# Patient Record
Sex: Female | Born: 1960 | Race: White | Hispanic: No | Marital: Married | Smoking: Never smoker
Health system: Southern US, Community
[De-identification: ages and names within clinical notes are randomized; demographics above are authoritative.]

## PROBLEM LIST (undated history)

## (undated) ENCOUNTER — Emergency Department (HOSPITAL_COMMUNITY): Admission: EM | Disposition: A | Discharge status: Home / Self Care | Payer: Self-pay

---

## 1997-12-29 ENCOUNTER — Encounter: Admission: RE | Admit: 1997-12-29 | Discharge: 1997-12-29 | Payer: Self-pay | Admitting: *Deleted

## 1998-03-23 ENCOUNTER — Ambulatory Visit (HOSPITAL_COMMUNITY): Admission: RE | Admit: 1998-03-23 | Discharge: 1998-03-23 | Payer: Self-pay | Admitting: Obstetrics & Gynecology

## 1998-04-18 ENCOUNTER — Encounter: Payer: Self-pay | Admitting: Obstetrics and Gynecology

## 1998-04-18 ENCOUNTER — Ambulatory Visit (HOSPITAL_COMMUNITY): Admission: RE | Admit: 1998-04-18 | Discharge: 1998-04-18 | Payer: Self-pay | Admitting: Obstetrics and Gynecology

## 1998-09-26 ENCOUNTER — Inpatient Hospital Stay (HOSPITAL_COMMUNITY): Admission: AD | Admit: 1998-09-26 | Discharge: 1998-09-28 | Payer: Self-pay | Admitting: Obstetrics and Gynecology

## 2010-03-01 ENCOUNTER — Encounter
Admission: RE | Admit: 2010-03-01 | Discharge: 2010-03-01 | Payer: Self-pay | Source: Home / Self Care | Attending: Orthopedic Surgery | Admitting: Orthopedic Surgery

## 2010-04-14 ENCOUNTER — Encounter: Payer: Self-pay | Admitting: Orthopedic Surgery

## 2012-05-19 ENCOUNTER — Ambulatory Visit: Payer: PRIVATE HEALTH INSURANCE | Admitting: Family Medicine

## 2012-05-19 VITALS — BP 106/68 | HR 71 | Temp 98.0°F | Resp 16 | Ht 65.5 in | Wt 124.0 lb

## 2012-05-19 DIAGNOSIS — Z5321 Procedure and treatment not carried out due to patient leaving prior to being seen by health care provider: Secondary | ICD-10-CM

## 2012-05-19 NOTE — Progress Notes (Signed)
Went to see pt- she was not in room and not in restrooms, must have left prior to being seen

## 2012-05-25 ENCOUNTER — Encounter: Payer: Self-pay | Admitting: Internal Medicine

## 2012-05-25 ENCOUNTER — Ambulatory Visit (INDEPENDENT_AMBULATORY_CARE_PROVIDER_SITE_OTHER): Payer: PRIVATE HEALTH INSURANCE | Admitting: Internal Medicine

## 2012-05-25 VITALS — BP 108/66 | HR 61 | Temp 98.3°F | Resp 18 | Ht 65.0 in | Wt 124.0 lb

## 2012-05-25 DIAGNOSIS — Z13220 Encounter for screening for lipoid disorders: Secondary | ICD-10-CM

## 2012-05-25 LAB — LIPID PANEL
Cholesterol: 212 mg/dL — ABNORMAL HIGH (ref 0–200)
HDL: 72 mg/dL (ref >39)
LDL Cholesterol: 125 mg/dL — ABNORMAL HIGH (ref 0–99)
Total CHOL/HDL Ratio: 2.9 Ratio
Triglycerides: 74 mg/dL (ref <150)
VLDL: 15 mg/dL (ref 0–40)

## 2012-05-25 NOTE — Progress Notes (Signed)
  Subjective:    Patient ID: Tiffany Santos, female    DOB: 03/06/1961, 52 y.o.   MRN: [id number]  HPI very healthy 52 year old no illnesses no medications in fine physical condition with no extra weight he needs a cholesterol screening to meet the requirements of her husband's insurance policy    Review of Systems 13 point review of systems negative    Objective:   Physical Exam Vital signs within normal limits       Assessment & Plan:  Problem #1 screening for hyperlipidemia

## 2013-09-28 ENCOUNTER — Ambulatory Visit: Payer: Managed Care, Other (non HMO) | Admitting: Rehabilitative and Restorative Service Providers"

## 2014-04-28 ENCOUNTER — Emergency Department (HOSPITAL_COMMUNITY)
Admission: EM | Admit: 2014-04-28 | Discharge: 2014-04-28 | Disposition: A | Discharge status: Home / Self Care | Payer: PRIVATE HEALTH INSURANCE | Source: Home / Self Care | Attending: Emergency Medicine | Admitting: Emergency Medicine

## 2014-04-28 ENCOUNTER — Encounter (HOSPITAL_COMMUNITY): Payer: Self-pay

## 2014-04-28 DIAGNOSIS — M25511 Pain in right shoulder: Secondary | ICD-10-CM

## 2014-04-28 MED ORDER — IBUPROFEN 800 MG PO TABS: 800.0000 mg | ORAL_TABLET | Freq: Three times a day (TID) | ORAL | Status: AC | PRN

## 2014-04-28 MED ORDER — HYDROCODONE-ACETAMINOPHEN 5-325 MG PO TABS: 1.0000 | ORAL_TABLET | Freq: Every evening | ORAL | Status: AC | PRN

## 2014-04-28 NOTE — ED Provider Notes (Signed)
CSN: [id number]     Arrival date & time 04/28/14  0851 History   First MD Initiated Contact with Patient 04/28/14 [id number]     Chief Complaint  Patient presents with  . Shoulder Pain   (Consider location/radiation/quality/duration/timing/severity/associated sxs/prior Treatment) HPI  She is a 54 year old woman here for evaluation of right shoulder pain. She states on Wednesday morning she stretched her arms overhead and felt something give in her right posterior shoulder. Over the next several hours had gradual onset of pain. Her pain is worse with looking down, her arm hanging at her side, laying on her back. She has tried some Advil without improvement. She saw a chiropractor yesterday without improvement. She has full range of motion in her shoulder and neck. The pain is described as aching and is primarily located in the posterior shoulder. It will radiate to her upper arm as well. No weakness, numbness, tingling.  History reviewed. No pertinent past medical history. History reviewed. No pertinent past surgical history. Family History  Problem Relation Age of Onset  . Cancer Paternal Grandmother    History  Substance Use Topics  . Smoking status: Never Smoker   . Smokeless tobacco: Not on file  . Alcohol Use: Yes     Comment: 2 weekly   OB History    No data available     Review of Systems As in history of present illness Allergies  Review of patient's allergies indicates no known allergies.  Home Medications   Prior to Admission medications   Medication Sig Start Date End Date Taking? Authorizing Provider  HYDROcodone-acetaminophen (NORCO) 5-325 MG per tablet Take 1 tablet by mouth at bedtime as needed for moderate pain or severe pain. 04/28/14   Charm RingsErin J Starnisha Batrez, MD  ibuprofen (ADVIL,MOTRIN) 800 MG tablet Take 1 tablet (800 mg total) by mouth every 8 (eight) hours as needed for moderate pain. 04/28/14   Charm RingsErin J Oronde Hallenbeck, MD   BP 122/80 mmHg  Pulse 71  Temp(Src) 98.3 F (36.8 C) (Oral)   Resp 16  SpO2 99% Physical Exam  Constitutional: She is oriented to person, place, and time. She appears well-developed and well-nourished. She appears distressed (mild).  Neck: Neck supple.  Cardiovascular: Normal rate.   Pulmonary/Chest: Effort normal.  Musculoskeletal:  Right shoulder: No erythema or edema. The right scapula is riding higher compared to the left. No bony tenderness. She is tender to palpation along the medial aspect of the scapula as well as over the supraspinatus. No bicipital groove tenderness. Empty can is positive. Juanetta GoslingHawkins is negative. 5 out of 5 grip strength. She has full active range of motion.  No vertebral tenderness.  Neurological: She is alert and oriented to person, place, and time.    ED Course  Procedures (including critical care time) Labs Review Labs Reviewed - No data to display  Imaging Review No results found.   MDM   1. Right shoulder pain    Suspect this is secondary to muscular injury. No bony tenderness. Prescription for 800 mg ibuprofen and Norco provided to use as needed. Provided referral to physical therapy. Follow-up with orthopedics if no improvement in 1-2 weeks.    Charm RingsErin J Owais Pruett, MD 04/28/14 [phone]

## 2014-04-28 NOTE — ED Notes (Signed)
States she did some stretches on Wednesday AM, felt pain in neck and right shoulder. No relief  After seen in chiropractor office

## 2014-04-28 NOTE — Discharge Instructions (Signed)
You likely pulled or strained some of the muscles in your shoulder. Use the sling for comfort. Take ibuprofen 800 mg every 8 hours as needed for pain. Use the Norco at bedtime to help you sleep.  Do not drive while taking this medicine. Do gentle range of motion exercises. I have provided a referral to physical therapy. If there is no improvement in the next 1-2 weeks, please follow-up with orthopedics.

## 2014-04-30 ENCOUNTER — Encounter (HOSPITAL_COMMUNITY): Payer: Self-pay | Admitting: Emergency Medicine

## 2014-04-30 ENCOUNTER — Emergency Department (INDEPENDENT_AMBULATORY_CARE_PROVIDER_SITE_OTHER): Payer: PRIVATE HEALTH INSURANCE

## 2014-04-30 ENCOUNTER — Emergency Department (INDEPENDENT_AMBULATORY_CARE_PROVIDER_SITE_OTHER)
Admission: EM | Admit: 2014-04-30 | Discharge: 2014-04-30 | Disposition: A | Discharge status: Home / Self Care | Payer: PRIVATE HEALTH INSURANCE | Source: Home / Self Care | Attending: Emergency Medicine | Admitting: Emergency Medicine

## 2014-04-30 DIAGNOSIS — M25511 Pain in right shoulder: Secondary | ICD-10-CM

## 2014-04-30 MED ORDER — CYCLOBENZAPRINE HCL 10 MG PO TABS
10.0000 mg | ORAL_TABLET | Freq: Three times a day (TID) | ORAL | Status: AC | PRN
Start: 2014-04-30 — End: ?

## 2014-04-30 MED ORDER — PREDNISONE 20 MG PO TABS
40.0000 mg | ORAL_TABLET | Freq: Every day | ORAL | Status: AC
Start: 2014-04-30 — End: ?

## 2014-04-30 NOTE — ED Provider Notes (Addendum)
CSN: [id number]     Arrival date & time 04/30/14  0910 History   First MD Initiated Contact with Patient 04/30/14 [phone]     Chief Complaint  Patient presents with  . Shoulder Pain   (Consider location/radiation/quality/duration/timing/severity/associated sxs/prior Treatment) HPI  She is a 54 year old woman here for follow-up of right shoulder pain. She was seen 2 days ago for the same pain. That time she was given ibuprofen, Norco, and a prescription to physical therapy. She went to physical therapy yesterday which was beneficial. However, shortly after doing the physical therapy her shoulder seized up and the pain is worse. It remains just medial to her right scapula and over the top of her shoulder. The pain radiating into her upper arm is worse. No numbness, tingling, weakness. She reports that her muscles have been seizing up.  History reviewed. No pertinent past medical history. History reviewed. No pertinent past surgical history. Family History  Problem Relation Age of Onset  . Cancer Paternal Grandmother    History  Substance Use Topics  . Smoking status: Never Smoker   . Smokeless tobacco: Not on file  . Alcohol Use: Yes     Comment: 2 weekly   OB History    No data available     Review of Systems Right shoulder pain No weakness, numbness, tingling Allergies  Review of patient's allergies indicates no known allergies.  Home Medications   Prior to Admission medications   Medication Sig Start Date End Date Taking? Authorizing Provider  cyclobenzaprine (FLEXERIL) 10 MG tablet Take 1 tablet (10 mg total) by mouth 3 (three) times daily as needed for muscle spasms. 04/30/14   Charm Rings, MD  HYDROcodone-acetaminophen (NORCO) 5-325 MG per tablet Take 1 tablet by mouth at bedtime as needed for moderate pain or severe pain. 04/28/14   Charm Rings, MD  ibuprofen (ADVIL,MOTRIN) 800 MG tablet Take 1 tablet (800 mg total) by mouth every 8 (eight) hours as needed for moderate pain.  04/28/14   Charm Rings, MD   BP 126/78 mmHg  Pulse 76  Temp(Src) 97.9 F (36.6 C) (Oral)  Resp 20  SpO2 100% Physical Exam  Constitutional: She is oriented to person, place, and time. She appears well-developed and well-nourished. She appears distressed.  Cardiovascular: Normal rate.   Pulmonary/Chest: Effort normal.  Musculoskeletal:  Right shoulder: Muscle spasm along medial aspect of right shoulder blade. Tender over these muscles. 5 out of 5 grip strength. Neck: Full range of motion. No vertebral tenderness.  Neurological: She is alert and oriented to person, place, and time.    ED Course  Procedures (including critical care time) Labs Review Labs Reviewed - No data to display  Imaging Review No results found.   MDM   1. Right shoulder pain    Her pain is worse from 2 days ago, but similar in character. X-rays obtained. I do not see any obvious fracture or bony abnormality. Final read pending at time of discharge. She will call back this afternoon for her x-ray results. We'll add Flexeril for the muscle spasms. Discussed if no improvement in 1-2 weeks, she should follow-up with orthopedics.    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  Called and spoke with the patient and her husband. We'll call in a five-day course of prednisone.  If no improvement in 1-2 weeks, follow-up with orthopedics. Randal Buba  Audrie LiaJ   Stonewall Doss J Jule Whitsel, MD 04/30/14 [phone]

## 2014-04-30 NOTE — ED Notes (Signed)
Shoulder pain has continued, [facility] .  Reports ibuprofen and norco not helping.

## 2014-04-30 NOTE — Discharge Instructions (Signed)
Please call back this afternoon for your x-ray results. There is no obvious bony abnormality when I look at the films. Take Flexeril every 8 hours as needed for muscle spasms. Continue the ice and heat. Continue gentle massage. If no improvement in 1-2 weeks, please follow-up with orthopedics.

## 2014-06-07 ENCOUNTER — Other Ambulatory Visit (HOSPITAL_COMMUNITY)
Admission: RE | Admit: 2014-06-07 | Discharge: 2014-06-07 | Disposition: A | Discharge status: Home / Self Care | Payer: PRIVATE HEALTH INSURANCE | Source: Ambulatory Visit | Attending: Family Medicine | Admitting: Family Medicine

## 2014-06-07 ENCOUNTER — Other Ambulatory Visit: Payer: Self-pay | Admitting: Family Medicine

## 2014-06-07 DIAGNOSIS — Z124 Encounter for screening for malignant neoplasm of cervix: Secondary | ICD-10-CM | POA: Insufficient documentation

## 2014-06-09 LAB — CYTOLOGY - PAP

## 2016-02-27 IMAGING — DX DG CERVICAL SPINE COMPLETE 4+V
6 series · 6 of 6 positions shown · non-contrast
Comparison: None.

CLINICAL DATA: Acute onset of right shoulder weakness 4 days ago,
with gradual worsening pain in the cervical spine and right shoulder
since that time, initially related to reaching overhead. No discrete
injury.

EXAM:
CERVICAL SPINE  4+ VIEWS

[c-spine lat]
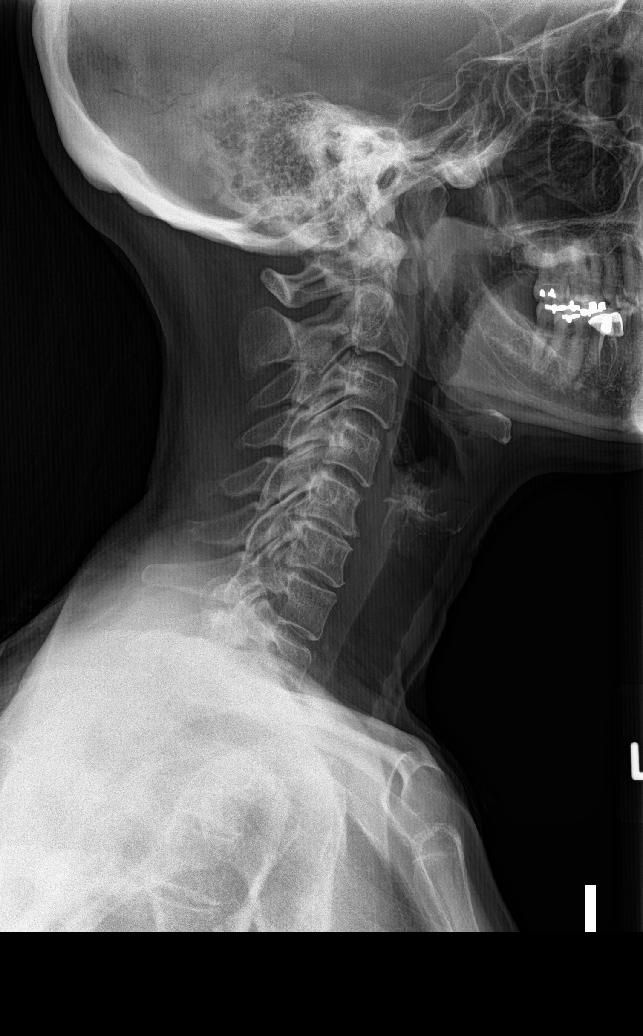

[c-spine obl (1 of 2)]
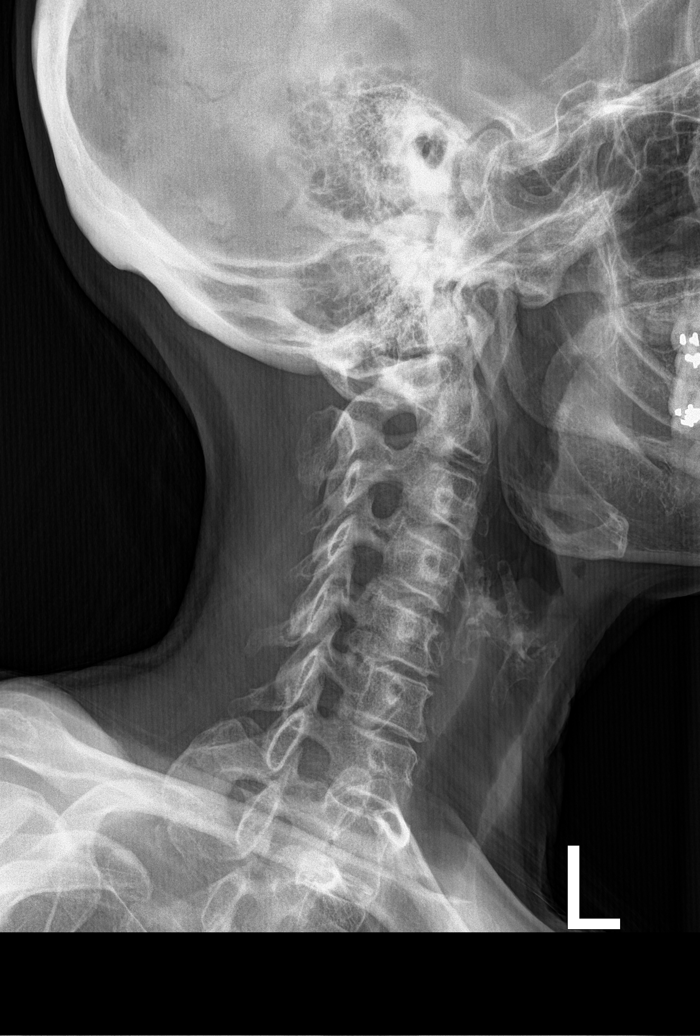

[c-spine obl (2 of 2)]
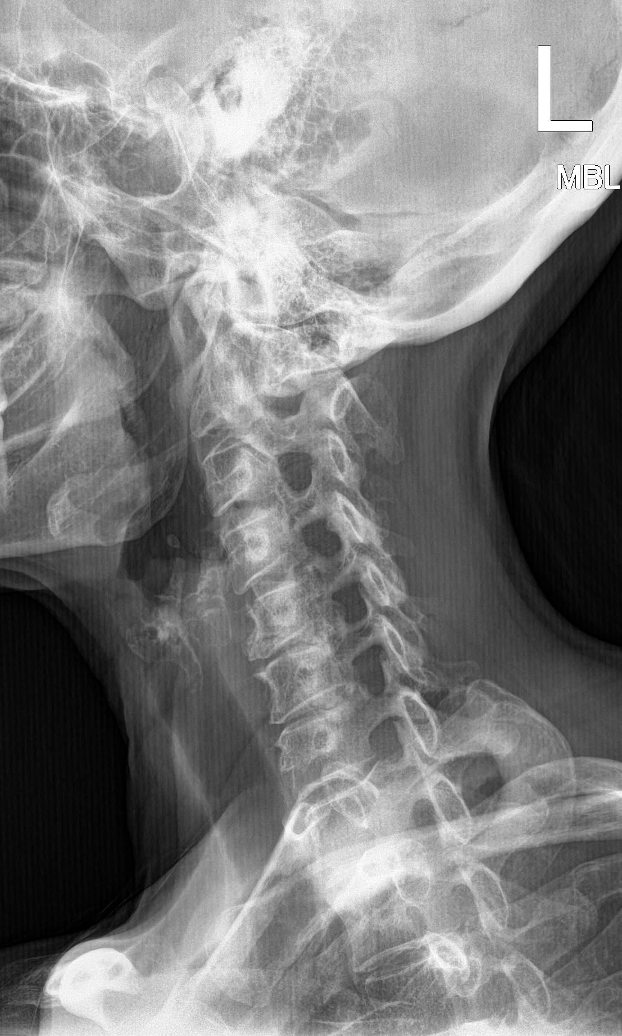

[c-spine ap]
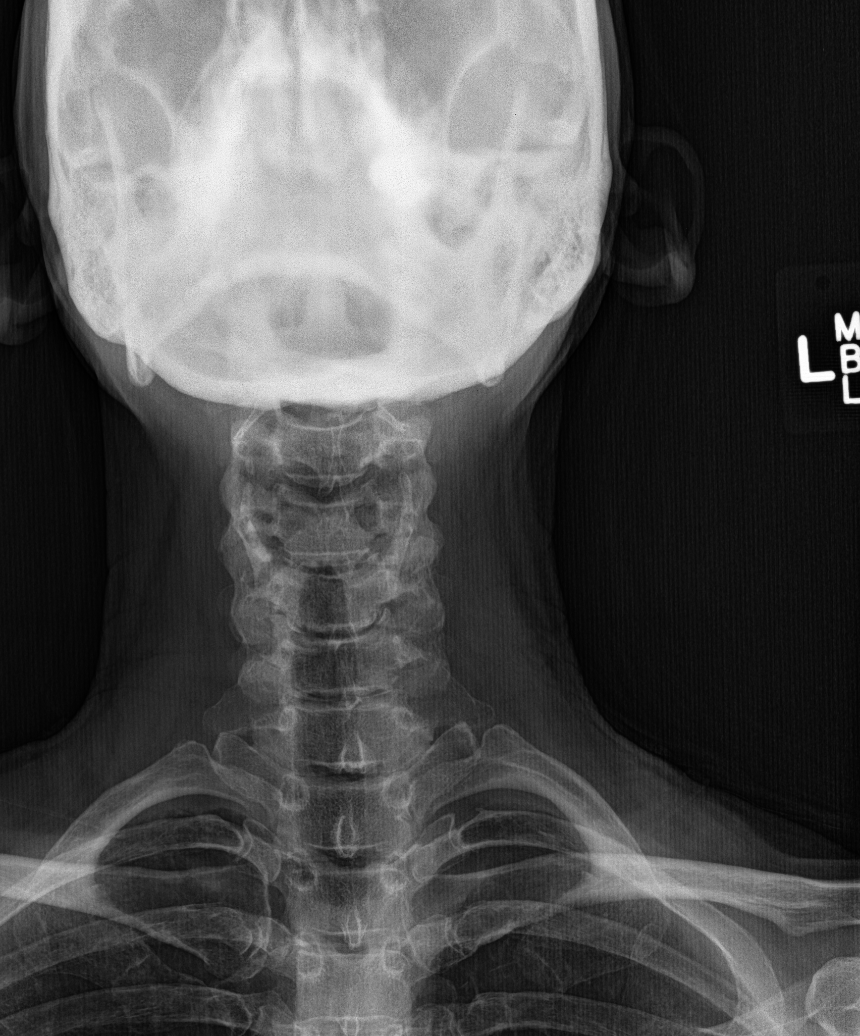

[c-spine open mouth (1 of 2)]
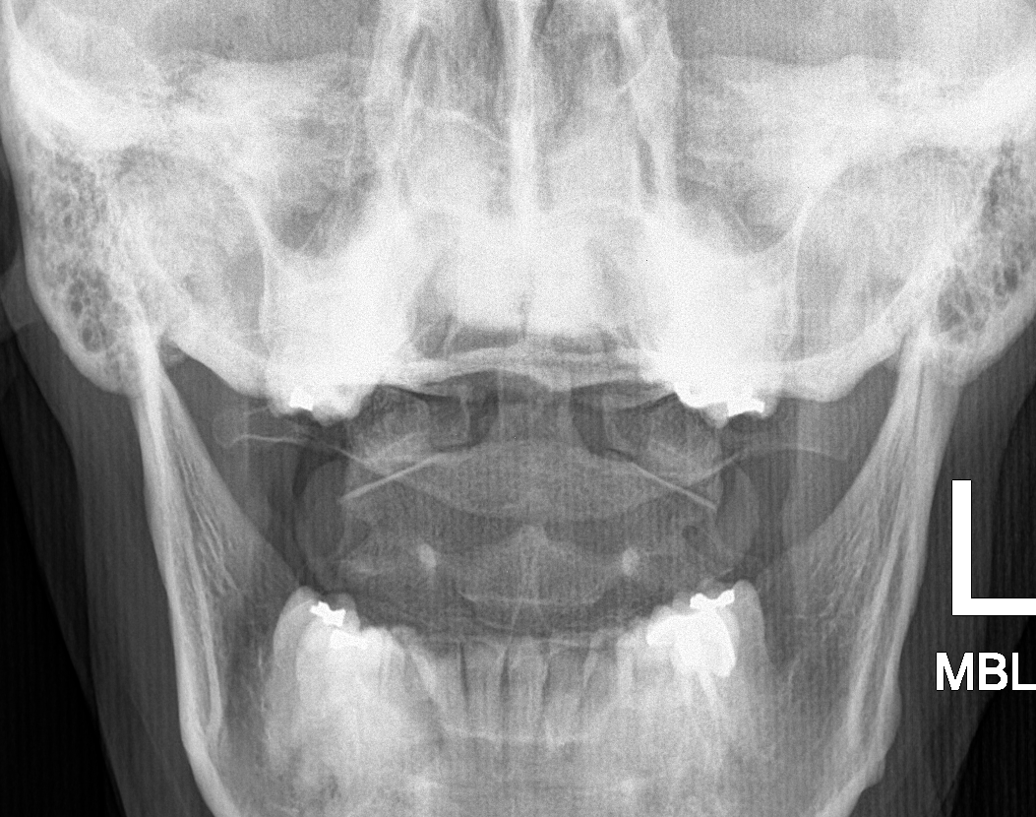

[c-spine open mouth (2 of 2)]
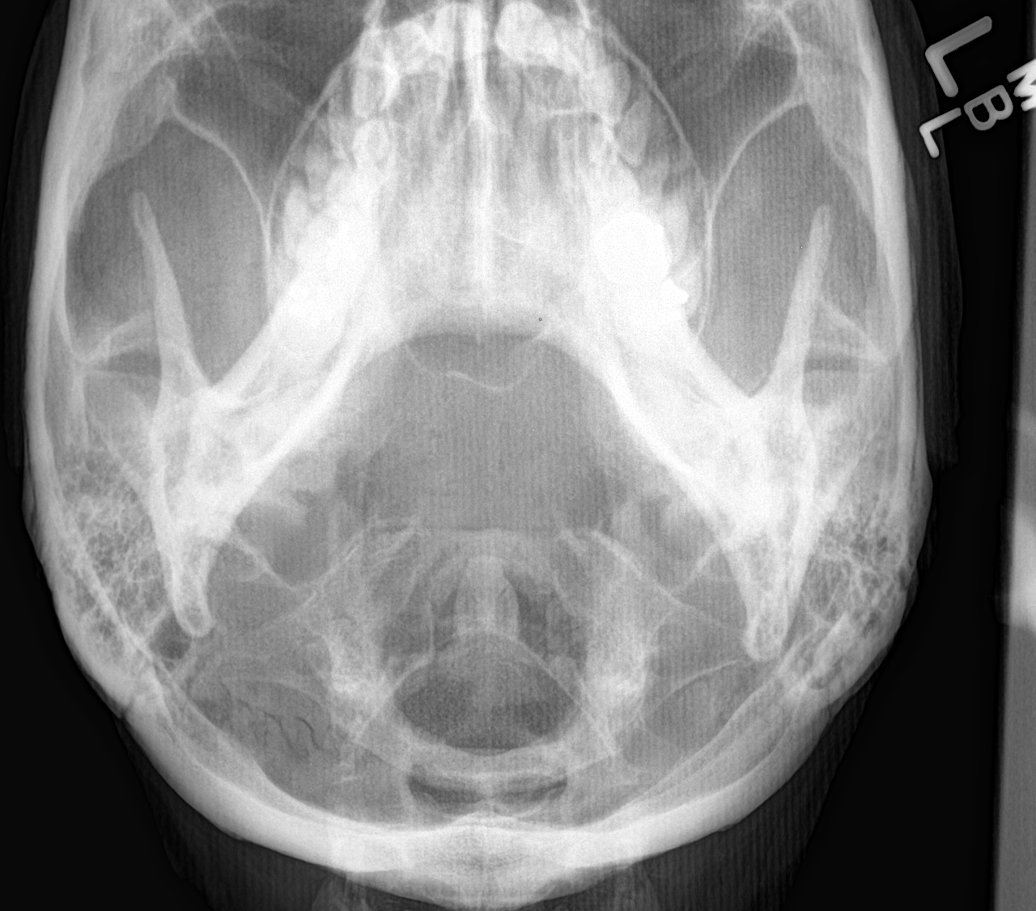

[6 of 6 positions shown; findings below may reference images not displayed]

FINDINGS: Straightening of the usual lordosis. 4 mm retrolisthesis of C6
relative to C7. No visible fractures. Moderate disc space narrowing
and endplate hypertrophic changes at C5-6. Mild disc space narrowing
and endplate hypertrophic changes at C6-7. Mild disc space narrowing
C4-5. Normal prevertebral soft tissues. Facet joints intact.
Moderate right and mild left foraminal stenosis at C5-6 due to
endplate hypertrophy.
IMPRESSION: 1. Degenerative 4 mm retrolisthesis of C6 relative to C7.
2. Moderate degenerative disc disease and spondylosis at C5-6 with
moderate right and mild left foraminal stenoses at this level.
3. Mild degenerative disc disease at C4-5 and C6-7.
